# Patient Record
Sex: Female | Born: 1990 | Race: Black or African American | Hispanic: No | Marital: Single | Smoking: Current every day smoker
Health system: Southern US, Community
[De-identification: ages and names within clinical notes are randomized; demographics above are authoritative.]

---

## 2018-10-04 ENCOUNTER — Other Ambulatory Visit: Payer: Self-pay

## 2018-10-04 ENCOUNTER — Emergency Department
Admission: EM | Admit: 2018-10-04 | Discharge: 2018-10-04 | Disposition: A | Discharge status: Home / Self Care | Payer: Self-pay | Attending: Emergency Medicine | Admitting: Emergency Medicine

## 2018-10-04 DIAGNOSIS — N739 Female pelvic inflammatory disease, unspecified: Secondary | ICD-10-CM

## 2018-10-04 LAB — COMPREHENSIVE METABOLIC PANEL
ALT: 16 U/L (ref 0–44)
ANION GAP: 6 (ref 5–15)
AST: 22 U/L (ref 15–41)
Albumin: 3.8 g/dL (ref 3.5–5.0)
Alkaline Phosphatase: 57 U/L (ref 38–126)
BUN: 10 mg/dL (ref 6–20)
CHLORIDE: 108 mmol/L (ref 98–111)
CO2: 22 mmol/L (ref 22–32)
Calcium: 8.2 mg/dL — ABNORMAL LOW (ref 8.9–10.3)
Creatinine, Ser: 0.75 mg/dL (ref 0.44–1.00)
GFR calc Af Amer: >60 mL/min (ref >60)
GFR calc non Af Amer: >60 mL/min (ref >60)
Glucose, Bld: 104 mg/dL — ABNORMAL HIGH (ref 70–99)
Potassium: 3.3 mmol/L — ABNORMAL LOW (ref 3.5–5.1)
Sodium: 136 mmol/L (ref 135–145)
Total Bilirubin: 0.3 mg/dL (ref 0.3–1.2)
Total Protein: 7.2 g/dL (ref 6.5–8.1)

## 2018-10-04 LAB — CBC
HEMATOCRIT: 34.7 % — AB (ref 36.0–46.0)
Hemoglobin: 11.2 g/dL — ABNORMAL LOW (ref 12.0–15.0)
MCH: 26.4 pg (ref 26.0–34.0)
MCHC: 32.3 g/dL (ref 30.0–36.0)
MCV: 81.6 fL (ref 80.0–100.0)
Platelets: 170 10*3/uL (ref 150–400)
RBC: 4.25 MIL/uL (ref 3.87–5.11)
RDW: 16.5 % — ABNORMAL HIGH (ref 11.5–15.5)
WBC: 3.2 10*3/uL — ABNORMAL LOW (ref 4.0–10.5)
nRBC: 0 % (ref 0.0–0.2)

## 2018-10-04 LAB — POCT PREGNANCY, URINE: PREG TEST UR: NEGATIVE

## 2018-10-04 LAB — CHLAMYDIA/NGC RT PCR (ARMC ONLY)
Chlamydia Tr: NOT DETECTED
N gonorrhoeae: NOT DETECTED

## 2018-10-04 LAB — URINALYSIS, COMPLETE (UACMP) WITH MICROSCOPIC
BACTERIA UA: NONE SEEN
Bilirubin Urine: NEGATIVE
Glucose, UA: NEGATIVE mg/dL
Hgb urine dipstick: NEGATIVE
Ketones, ur: NEGATIVE mg/dL
Leukocytes, UA: NEGATIVE
Nitrite: NEGATIVE
Protein, ur: 30 mg/dL — AB
Specific Gravity, Urine: 1.031 — ABNORMAL HIGH (ref 1.005–1.030)
pH: 5 (ref 5.0–8.0)

## 2018-10-04 LAB — WET PREP, GENITAL
Clue Cells Wet Prep HPF POC: NONE SEEN
Sperm: NONE SEEN
Trich, Wet Prep: NONE SEEN
Yeast Wet Prep HPF POC: NONE SEEN

## 2018-10-04 LAB — LIPASE, BLOOD: Lipase: 30 U/L (ref 11–51)

## 2018-10-04 MED ORDER — HYDROCODONE-ACETAMINOPHEN 5-325 MG PO TABS: 1.0000 | ORAL_TABLET | Freq: Four times a day (QID) | ORAL | 0 refills | Status: AC | PRN

## 2018-10-04 MED ORDER — CEFTRIAXONE SODIUM 250 MG IJ SOLR
250.0000 mg | Freq: Once | INTRAMUSCULAR | Status: AC
  Administered 2018-10-04: 250 mg via INTRAMUSCULAR
  Filled 2018-10-04: qty 250

## 2018-10-04 MED ORDER — DOXYCYCLINE HYCLATE 100 MG PO TABS: 100.0000 mg | ORAL_TABLET | Freq: Two times a day (BID) | ORAL | 0 refills | Status: AC

## 2018-10-04 MED ORDER — IBUPROFEN 600 MG PO TABS
600.0000 mg | ORAL_TABLET | Freq: Once | ORAL | Status: AC
  Administered 2018-10-04: 600 mg via ORAL
  Filled 2018-10-04: qty 1

## 2018-10-04 MED ORDER — AZITHROMYCIN 500 MG PO TABS
1000.0000 mg | ORAL_TABLET | Freq: Once | ORAL | Status: AC
  Administered 2018-10-04: 1000 mg via ORAL
  Filled 2018-10-04: qty 2

## 2018-10-04 NOTE — ED Provider Notes (Signed)
[facility] Emergency Department Provider Note  ____________________________________________   First MD Initiated Contact with Patient 10/04/18 [phone]     (approximate)  I have reviewed the triage vital signs and the nursing notes.   HISTORY  Chief Complaint Abdominal Pain   HPI Kim Mendez is a 27 y.o. female who presents to the emergency department with  right greater than left lower abdominal/pelvic pain for the past several days.  She is concerned because her boyfriend's ex-girlfriend notified him yesterday that she had contracted a sexually transmitted infection and the patient herself is worried that she may have contracted it as well.  She gets routine HIV testing and had her last negative test in November 2019.  The patient's last sexual intercourse was a week ago.  She denies increased vaginal discharge.  No dysuria frequency or hesitancy.  Her abdominal pain is mild to moderate severity cramping and aching and mostly constant.  Is nonradiating.  Nothing seems to make it better or worse.   No past medical history on file.  There are no active problems to display for this patient.     Prior to Admission medications   Medication Sig Start Date End Date Taking? Authorizing Provider  doxycycline (VIBRA-TABS) 100 MG tablet Take 1 tablet (100 mg total) by mouth 2 (two) times daily. 10/04/18   Merrily Brittleifenbark, Larsen Dungan, MD  HYDROcodone-acetaminophen (NORCO) 5-325 MG tablet Take 1 tablet by mouth every 6 (six) hours as needed for up to 7 doses for severe pain. 10/04/18   Merrily Brittleifenbark, Landry Lookingbill, MD    Allergies Patient has no known allergies.  No family history on file.  Social History Social History   Tobacco Use  . Smoking status: Not on file  Substance Use Topics  . Alcohol use: Not on file  . Drug use: Not on file    Review of Systems Constitutional: No fever/chills Eyes: No visual changes. ENT: No sore throat. Cardiovascular: Denies chest  pain. Respiratory: Denies shortness of breath. Gastrointestinal: Positive for abdominal pain.  No nausea, no vomiting.  No diarrhea.  No constipation. Genitourinary: Positive for pelvic pain Musculoskeletal: Negative for back pain. Skin: Negative for rash. Neurological: Negative for headaches, focal weakness or numbness.   ____________________________________________   PHYSICAL EXAM:  VITAL SIGNS: ED Triage Vitals  Enc Vitals Group     BP 10/04/18 0134 (!) 112/58     Pulse Rate 10/04/18 0134 78     Resp 10/04/18 0134 18     Temp 10/04/18 0134 99.5 F (37.5 C)     Temp Source 10/04/18 0134 Oral     SpO2 10/04/18 0134 99 %     Weight 10/04/18 0132 207 lb (93.9 kg)     Height 10/04/18 0132 5\' 2"  (1.575 m)     Head Circumference --      Peak Flow --      Pain Score 10/04/18 0132 0     Pain Loc --      Pain Edu? --      Excl. in GC? --     Constitutional: Alert and oriented x4 appears somewhat uncomfortable though nontoxic no diaphoresis speaks in full clear sentences Eyes: PERRL EOMI. Head: Atraumatic. Nose: No congestion/rhinnorhea. Mouth/Throat: No trismus Neck: No stridor.   Cardiovascular: Normal rate, regular rhythm. Grossly normal heart sounds.  Good peripheral circulation. Respiratory: Normal respiratory effort.  No retractions. Lungs CTAB and moving good air Gastrointestinal: Soft nondistended mild right greater than left suprapubic tenderness.  No McBurney's tenderness.  Negative Rovsing's. Pelvic exam chaperoned by female nurse: Normal external exam.  Office is closed.  Moderate amount of thick discharge in the vault although nonpurulent.  No cervical motion tenderness.  Positive right adnexal tenderness Musculoskeletal: No lower extremity edema   Neurologic:  Normal speech and language. No gross focal neurologic deficits are appreciated. Skin:  Skin is warm, dry and intact. No rash noted. Psychiatric: Mood and affect are normal. Speech and behavior are  normal.    ____________________________________________   DIFFERENTIAL includes but not limited to  Pelvic inflammatory disease, cervicitis, appendicitis, urinary tract infection ____________________________________________   LABS (all labs ordered are listed, but only abnormal results are displayed)  Labs Reviewed  WET PREP, GENITAL - Abnormal; Notable for the following components:      Result Value   WBC, Wet Prep HPF POC RARE (*)    All other components within normal limits  COMPREHENSIVE METABOLIC PANEL - Abnormal; Notable for the following components:   Potassium 3.3 (*)    Glucose, Bld 104 (*)    Calcium 8.2 (*)    All other components within normal limits  CBC - Abnormal; Notable for the following components:   WBC 3.2 (*)    Hemoglobin 11.2 (*)    HCT 34.7 (*)    RDW 16.5 (*)    All other components within normal limits  URINALYSIS, COMPLETE (UACMP) WITH MICROSCOPIC - Abnormal; Notable for the following components:   Color, Urine AMBER (*)    APPearance CLOUDY (*)    Specific Gravity, Urine 1.031 (*)    Protein, ur 30 (*)    All other components within normal limits  CHLAMYDIA/NGC RT PCR ([facility] ONLY)  LIPASE, BLOOD  POCT PREGNANCY, URINE    Lab work reviewed by me is negative for gonorrhea and chlamydia.  Slightly low white count is nonspecific __________________________________________  EKG   ____________________________________________  RADIOLOGY   ____________________________________________   PROCEDURES  Procedure(s) performed: no  Procedures  Critical Care performed: no  ____________________________________________   INITIAL IMPRESSION / ASSESSMENT AND PLAN / ED COURSE  Pertinent labs & imaging results that were available during my care of the patient were reviewed by me and considered in my medical decision making (see chart for details).   As part of my medical decision making, I reviewed the following data within the  electronic MEDICAL RECORD NUMBER History obtained from family if available, nursing notes, old chart and ekg, as well as notes from prior ED visits.  Patient comes to the emergency department with several days of pelvic pain.  She is quite concerned understandably so about sexually transmitted infection.  Her abdominal pain is on the right greater than left although it is well below McBurney's point and she has no rebound or guarding.  Pelvic exam performed showing mostly physiologic discharge although she does have right adnexal tenderness.  We discussed calling back with results versus presumptive treatment now and she is opted for presumptive treatment which I think is most reasonable.  Given ceftriaxone as well as azithromycin and I will discharge her home with doxycycline and hydrocodone for the pain.  Strict return precautions have been given and we will call her back with the results.      ____________________________________________   FINAL CLINICAL IMPRESSION(S) / ED DIAGNOSES  Final diagnoses:  Pelvic inflammatory disease  Cluster B personality disorder (HCC)      NEW MEDICATIONS STARTED DURING THIS VISIT:  Discharge Medication List as of 10/04/2018  6:30 AM  START taking these medications   Details  doxycycline (VIBRA-TABS) 100 MG tablet Take 1 tablet (100 mg total) by mouth 2 (two) times daily., Starting Mon 10/04/2018, Print    HYDROcodone-acetaminophen (NORCO) 5-325 MG tablet Take 1 tablet by mouth every 6 (six) hours as needed for up to 7 doses for severe pain., Starting Mon 10/04/2018, Print         Note:  This document was prepared using Dragon voice recognition software and may include unintentional dictation errors.    Merrily Brittleifenbark, Makayle Krahn, MD 10/06/18 [phone]

## 2018-10-04 NOTE — ED Triage Notes (Signed)
Patient states off/on abdominal pain.  States found out today that her boyfriend was told he had been exposed to an STD.

## 2018-10-04 NOTE — Discharge Instructions (Signed)
It was a pleasure to take care of you today, and thank you for coming to our emergency department.  If you have any questions or concerns before leaving please ask the nurse to grab me and I'm more than happy to go through your aftercare instructions again.  If you were prescribed any opioid pain medication today such as Norco, Vicodin, Percocet, morphine, hydrocodone, or oxycodone please make sure you do not drive when you are taking this medication as it can alter your ability to drive safely.  If you have any concerns once you are home that you are not improving or are in fact getting worse before you can make it to your follow-up appointment, please do not hesitate to call 911 and come back for further evaluation.  Merrily BrittleNeil Odis Wickey, MD  Results for orders placed or performed during the hospital encounter of 10/04/18  Lipase, blood  Result Value Ref Range   Lipase 30 11 - 51 U/L  Comprehensive metabolic panel  Result Value Ref Range   Sodium 136 135 - 145 mmol/L   Potassium 3.3 (L) 3.5 - 5.1 mmol/L   Chloride 108 98 - 111 mmol/L   CO2 22 22 - 32 mmol/L   Glucose, Bld 104 (H) 70 - 99 mg/dL   BUN 10 6 - 20 mg/dL   Creatinine, Ser 1.610.75 0.44 - 1.00 mg/dL   Calcium 8.2 (L) 8.9 - 10.3 mg/dL   Total Protein 7.2 6.5 - 8.1 g/dL   Albumin 3.8 3.5 - 5.0 g/dL   AST 22 15 - 41 U/L   ALT 16 0 - 44 U/L   Alkaline Phosphatase 57 38 - 126 U/L   Total Bilirubin 0.3 0.3 - 1.2 mg/dL   GFR calc non Af Amer >60 >60 mL/min   GFR calc Af Amer >60 >60 mL/min   Anion gap 6 5 - 15  CBC  Result Value Ref Range   WBC 3.2 (L) 4.0 - 10.5 K/uL   RBC 4.25 3.87 - 5.11 MIL/uL   Hemoglobin 11.2 (L) 12.0 - 15.0 g/dL   HCT 09.634.7 (L) 04.536.0 - 40.946.0 %   MCV 81.6 80.0 - 100.0 fL   MCH 26.4 26.0 - 34.0 pg   MCHC 32.3 30.0 - 36.0 g/dL   RDW 81.116.5 (H) 91.411.5 - 78.215.5 %   Platelets 170 150 - 400 K/uL   nRBC 0.0 0.0 - 0.2 %  Urinalysis, Complete w Microscopic  Result Value Ref Range   Color, Urine AMBER (A) YELLOW   APPearance CLOUDY (A) CLEAR   Specific Gravity, Urine 1.031 (H) 1.005 - 1.030   pH 5.0 5.0 - 8.0   Glucose, UA NEGATIVE NEGATIVE mg/dL   Hgb urine dipstick NEGATIVE NEGATIVE   Bilirubin Urine NEGATIVE NEGATIVE   Ketones, ur NEGATIVE NEGATIVE mg/dL   Protein, ur 30 (A) NEGATIVE mg/dL   Nitrite NEGATIVE NEGATIVE   Leukocytes, UA NEGATIVE NEGATIVE   RBC / HPF 6-10 0 - 5 RBC/hpf   WBC, UA 0-5 0 - 5 WBC/hpf   Bacteria, UA NONE SEEN NONE SEEN   Squamous Epithelial / LPF 21-50 0 - 5   Mucus PRESENT   Pregnancy, urine POC  Result Value Ref Range   Preg Test, Ur NEGATIVE NEGATIVE

## 2018-10-05 ENCOUNTER — Telehealth: Payer: Self-pay | Admitting: Emergency Medicine

## 2018-10-05 NOTE — Telephone Encounter (Signed)
Patient called and asked for results for std.  Gave her results, but instructed to finish all antibiotics because of dx pid.  She agrees.

## 2019-03-23 ENCOUNTER — Other Ambulatory Visit: Payer: Self-pay

## 2019-03-23 ENCOUNTER — Encounter: Payer: Self-pay | Admitting: *Deleted

## 2019-03-23 DIAGNOSIS — X500XXA Overexertion from strenuous movement or load, initial encounter: Secondary | ICD-10-CM | POA: Insufficient documentation

## 2019-03-23 DIAGNOSIS — Z79899 Other long term (current) drug therapy: Secondary | ICD-10-CM | POA: Insufficient documentation

## 2019-03-23 DIAGNOSIS — Y929 Unspecified place or not applicable: Secondary | ICD-10-CM | POA: Insufficient documentation

## 2019-03-23 DIAGNOSIS — S29012A Strain of muscle and tendon of back wall of thorax, initial encounter: Secondary | ICD-10-CM | POA: Insufficient documentation

## 2019-03-23 DIAGNOSIS — Y9389 Activity, other specified: Secondary | ICD-10-CM | POA: Insufficient documentation

## 2019-03-23 DIAGNOSIS — F17200 Nicotine dependence, unspecified, uncomplicated: Secondary | ICD-10-CM | POA: Insufficient documentation

## 2019-03-23 DIAGNOSIS — Y990 Civilian activity done for income or pay: Secondary | ICD-10-CM | POA: Insufficient documentation

## 2019-03-23 NOTE — ED Triage Notes (Addendum)
Pt picked up a heavy object yesterday and now has upper back pain.  No otc meds .  Pt alert.

## 2019-03-24 ENCOUNTER — Encounter: Payer: Self-pay | Admitting: Emergency Medicine

## 2019-03-24 ENCOUNTER — Emergency Department
Admission: EM | Admit: 2019-03-24 | Discharge: 2019-03-24 | Disposition: A | Discharge status: Home / Self Care | Payer: Self-pay | Attending: Emergency Medicine | Admitting: Emergency Medicine

## 2019-03-24 DIAGNOSIS — S29012A Strain of muscle and tendon of back wall of thorax, initial encounter: Secondary | ICD-10-CM

## 2019-03-24 MED ORDER — CYCLOBENZAPRINE HCL 10 MG PO TABS
5.0000 mg | ORAL_TABLET | Freq: Once | ORAL | Status: AC
  Administered 2019-03-24: 5 mg via ORAL
  Filled 2019-03-24: qty 1

## 2019-03-24 MED ORDER — IBUPROFEN 600 MG PO TABS
600.0000 mg | ORAL_TABLET | Freq: Once | ORAL | Status: AC
  Administered 2019-03-24: 600 mg via ORAL
  Filled 2019-03-24: qty 1

## 2019-03-24 MED ORDER — ACETAMINOPHEN 500 MG PO TABS
1000.0000 mg | ORAL_TABLET | Freq: Once | ORAL | Status: AC
  Administered 2019-03-24: 1000 mg via ORAL
  Filled 2019-03-24: qty 2

## 2019-03-24 MED ORDER — LIDOCAINE 5 % EX PTCH: 1.0000 | MEDICATED_PATCH | Freq: Two times a day (BID) | CUTANEOUS | 0 refills | Status: AC

## 2019-03-24 MED ORDER — LIDOCAINE 5 % EX PTCH
1.0000 | MEDICATED_PATCH | CUTANEOUS | Status: DC
  Filled 2019-03-24: qty 1

## 2019-03-24 MED ORDER — CYCLOBENZAPRINE HCL 5 MG PO TABS: 5.0000 mg | ORAL_TABLET | Freq: Three times a day (TID) | ORAL | 0 refills | Status: DC | PRN

## 2019-03-24 NOTE — ED Provider Notes (Signed)
[facility] Emergency Department Provider Note  ____________________________________________   First MD Initiated Contact with Patient 03/24/19 [phone]     (approximate)  I have reviewed the triage vital signs and the nursing notes.   HISTORY  Chief Complaint Back Pain    HPI Kim Mendez is a 28 y.o. female reports no chronic medical issues and presents for evaluation of acute onset pain in the middle of her back on the right side.  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  At the time it did not bother her too much but by today it was quite painful and she felt like it was limiting her ability to raise both of her arms.  It feels tight and feels like it is spasming or cramping.  Nothing particular makes it better and moving around makes it worse.  She feels a little bit better when she sits up as opposed to lies down.  She has not had back problems in the past.  No numbness nor tingling, no weakness in either extremity, no difficulty with ambulation.  No recent contact with COVID-19 patients.  No fever, chills, cough, sore throat, chest pain, shortness of breath, nausea, vomiting, nor abdominal pain.         History reviewed. No pertinent past medical history.  There are no active problems to display for this patient.   History reviewed. No pertinent surgical history.  Prior to Admission medications   Medication Sig Start Date End Date Taking? Authorizing Provider  cyclobenzaprine (FLEXERIL) 5 MG tablet Take 1 tablet (5 mg total) by mouth 3 (three) times daily as needed for muscle spasms. 03/24/19   Loleta RoseForbach, Sircharles Holzheimer, MD  doxycycline (VIBRA-TABS) 100 MG tablet Take 1 tablet (100 mg total) by mouth 2 (two) times daily. 10/04/18   Merrily Brittleifenbark, Neil, MD  HYDROcodone-acetaminophen (NORCO) 5-325 MG tablet Take 1 tablet by mouth every 6 (six) hours as needed for  up to 7 doses for severe pain. 10/04/18   Merrily Brittleifenbark, Neil, MD  lidocaine (LIDODERM) 5 % Place 1 patch onto the skin every 12 (twelve) hours. Remove & Discard patch within 12 hours or as directed by MD.  Wynelle FannyLeave the patch off for 12 hours before applying a new one. 03/24/19 03/23/20  Loleta RoseForbach, Luciano Cinquemani, MD    Allergies Patient has no known allergies.  History reviewed. No pertinent family history.  Social History Social History   Tobacco Use   Smoking status: Current Every Day Smoker   Smokeless tobacco: Never Used  Substance Use Topics   Alcohol use: Not Currently   Drug use: Not Currently    Review of Systems Constitutional: No fever/chills Eyes: No visual changes. ENT: No sore throat. Cardiovascular: Denies chest pain. Respiratory: Denies shortness of breath. Gastrointestinal: No abdominal pain.  No nausea, no vomiting.  No diarrhea.  No constipation. Musculoskeletal: Pain in the right side of the middle of her back as described above. Neurological: Negative for headaches, focal weakness or numbness.   ____________________________________________   PHYSICAL EXAM:  VITAL SIGNS: ED Triage Vitals  Enc Vitals Group     BP 03/23/19 2055 100/61     Pulse Rate 03/23/19 2055 70     Resp 03/23/19 2055 18     Temp 03/23/19 2055 99.2 F (37.3 C)     Temp Source 03/23/19 2055 Oral     SpO2 03/23/19  2055 99 %     Weight 03/23/19 2059 89.8 kg (198 lb)     Height 03/23/19 2059 1.6 m (5\' 3" )     Head Circumference --      Peak Flow --      Pain Score 03/23/19 2057 8     Pain Loc --      Pain Edu? --      Excl. in GC? --     Constitutional: Alert and oriented. Well appearing and in no acute distress. Head: Atraumatic. Cardiovascular: Normal rate, regular rhythm. Good peripheral circulation. Grossly normal heart sounds. Respiratory: Normal respiratory effort.   Gastrointestinal: Soft and nontender. No distention.  Neurologic:  Normal speech and language. No gross focal  neurologic deficits are appreciated.  MSK: The patient has no tenderness to palpation of the spine but has some right-sided paraspinal muscle tenderness to palpation in the midthoracic region.  There is no erythema, no fluctuance, no induration, no evidence of acute infection. Skin:  Skin is warm, dry and intact. No rash noted. Psychiatric: Mood and affect are normal. Speech and behavior are normal.  ____________________________________________   LABS (all labs ordered are listed, but only abnormal results are displayed)  Labs Reviewed - No data to display ____________________________________________  EKG  None - EKG not ordered by ED physician ____________________________________________  RADIOLOGY   ED MD interpretation: No indication for imaging  Official radiology report(s): No results found.  ____________________________________________   PROCEDURES   Procedure(s) performed (including Critical Care):  Procedures   ____________________________________________   INITIAL IMPRESSION / MDM / ASSESSMENT AND PLAN / ED COURSE  As part of my medical decision making, I reviewed the following data within the electronic MEDICAL RECORD NUMBER Nursing notes reviewed and incorporated, Notes from prior ED visits and [place] Controlled Substance Database      *Kim Mendez was evaluated in Emergency Department on 03/24/2019 for the symptoms described in the history of present illness. She was evaluated in the context of the global COVID-19 pandemic, which necessitated consideration that the patient might be at risk for infection with the SARS-CoV-2 virus that causes COVID-19. Institutional protocols and algorithms that pertain to the evaluation of patients at risk for COVID-19 are in a state of rapid change based on information released by regulatory bodies including the CDC and federal and state organizations. These policies and algorithms were followed during the patient's care in the ED.   Some ED evaluations and interventions may be delayed as a result of limited staffing during the pandemic.*  Signs and symptoms are consistent with mild muscle strain.  No indication for imaging given a minimal mechanism, the patient's otherwise young and healthy status, and no tenderness to palpation of the spine itself.  Lidoderm, Flexeril, NSAIDs, Tylenol, heating pad, usual and customary return precautions.  She understands and agrees with the plan.      ____________________________________________  FINAL CLINICAL IMPRESSION(S) / ED DIAGNOSES  Final diagnoses:  Strain of thoracic back region     MEDICATIONS GIVEN DURING THIS VISIT:  Medications  cyclobenzaprine (FLEXERIL) tablet 5 mg (has no administration in time range)  lidocaine (LIDODERM) 5 % 1 patch (has no administration in time range)  ibuprofen (ADVIL) tablet 600 mg (has no administration in time range)  acetaminophen (TYLENOL) tablet 1,000 mg (has no administration in time range)     ED Discharge Orders         Ordered    lidocaine (LIDODERM) 5 %  Every 12 hours  03/24/19 0105    cyclobenzaprine (FLEXERIL) 5 MG tablet  3 times daily PRN     03/24/19 0105           Note:  This document was prepared using Dragon voice recognition software and may include unintentional dictation errors.   Hinda Kehr, MD 03/24/19 [phone]

## 2019-03-24 NOTE — ED Notes (Signed)
ED Provider at bedside. 

## 2020-09-20 ENCOUNTER — Emergency Department
Admission: EM | Admit: 2020-09-20 | Discharge: 2020-09-20 | Disposition: A | Discharge status: Home / Self Care | Payer: Self-pay | Attending: Emergency Medicine | Admitting: Emergency Medicine

## 2020-09-20 ENCOUNTER — Encounter: Payer: Self-pay | Admitting: *Deleted

## 2020-09-20 ENCOUNTER — Other Ambulatory Visit: Payer: Self-pay

## 2020-09-20 DIAGNOSIS — R030 Elevated blood-pressure reading, without diagnosis of hypertension: Secondary | ICD-10-CM | POA: Insufficient documentation

## 2020-09-20 DIAGNOSIS — M5431 Sciatica, right side: Secondary | ICD-10-CM | POA: Insufficient documentation

## 2020-09-20 DIAGNOSIS — F17200 Nicotine dependence, unspecified, uncomplicated: Secondary | ICD-10-CM | POA: Insufficient documentation

## 2020-09-20 MED ORDER — IBUPROFEN 600 MG PO TABS
600.0000 mg | ORAL_TABLET | Freq: Once | ORAL | Status: AC
  Administered 2020-09-20: 600 mg via ORAL
  Filled 2020-09-20: qty 1

## 2020-09-20 MED ORDER — PREDNISONE 20 MG PO TABS
60.0000 mg | ORAL_TABLET | Freq: Once | ORAL | Status: AC
  Administered 2020-09-20: 60 mg via ORAL
  Filled 2020-09-20: qty 3

## 2020-09-20 MED ORDER — IBUPROFEN 600 MG PO TABS: 600.0000 mg | ORAL_TABLET | Freq: Four times a day (QID) | ORAL | 0 refills | Status: AC | PRN

## 2020-09-20 MED ORDER — PREDNISONE 20 MG PO TABS: 60.0000 mg | ORAL_TABLET | Freq: Every day | ORAL | 0 refills | Status: AC

## 2020-09-20 MED ORDER — CYCLOBENZAPRINE HCL 10 MG PO TABS: 10.0000 mg | ORAL_TABLET | Freq: Three times a day (TID) | ORAL | 0 refills | Status: AC | PRN

## 2020-09-20 NOTE — Discharge Instructions (Signed)
Take the prednisone as prescribed, 60 mg daily for the next 5 days.  You should use the ibuprofen every 6 hours with meals.  You may take the Flexeril (cyclobenzaprine) up to 3 times daily if you have muscle spasm or tightness.  Return to the ER immediately for new, worsening, or persistent severe weakness, numbness, inability to move the leg, difficulty walking, or any other new or worsening symptoms that concern you.

## 2020-09-20 NOTE — ED Notes (Signed)
Arrived to pt's bedside to administer ordered medication; pt is not there nor is her visitor.

## 2020-09-20 NOTE — ED Triage Notes (Signed)
Pt is here for sharp pain radiating down right leg. Pain is worst when she moves from sitting to standing position and extra painful at night.  No recent trauma, pt has had same pain previously but never this long or this severe.

## 2020-09-20 NOTE — ED Notes (Signed)
Pt reports pain that encompasses the top of her right thigh and down to her knee and then intermittent right calf pain. Pt reports pain in her right calf with plantar flexion against resistance; pain to right knee with dorsiflexion against resistance

## 2020-09-20 NOTE — ED Provider Notes (Signed)
[facility] Emergency Department Provider Note ____________________________________________   Event Date/Time   First MD Initiated Contact with Patient 09/20/20 1704     (approximate)  I have reviewed the triage vital signs and the nursing notes.   HISTORY  Chief Complaint Leg Pain    HPI Kim Mendez is a 29 y.o. female with no active medical problems who presents with right leg pain over the last several days, initially presenting with numbness and tingling going down the leg, and now more painful.  It radiates from her buttocks down the leg.  She denies any weakness and is able to ambulate.  She has had no trauma or injury.  She denies any pain to the back.  History reviewed. No pertinent past medical history.  There are no problems to display for this patient.   History reviewed. No pertinent surgical history.  Prior to Admission medications   Medication Sig Start Date End Date Taking? Authorizing Provider  cyclobenzaprine (FLEXERIL) 10 MG tablet Take 1 tablet (10 mg total) by mouth 3 (three) times daily as needed for up to 5 days for muscle spasms. 09/20/20 09/25/20  Dionne Bucy, MD  doxycycline (VIBRA-TABS) 100 MG tablet Take 1 tablet (100 mg total) by mouth 2 (two) times daily. 10/04/18   Merrily Brittle, MD  HYDROcodone-acetaminophen (NORCO) 5-325 MG tablet Take 1 tablet by mouth every 6 (six) hours as needed for up to 7 doses for severe pain. 10/04/18   Merrily Brittle, MD  ibuprofen (ADVIL) 600 MG tablet Take 1 tablet (600 mg total) by mouth every 6 (six) hours as needed. 09/20/20   Dionne Bucy, MD  predniSONE (DELTASONE) 20 MG tablet Take 3 tablets (60 mg total) by mouth daily with breakfast for 5 days. 09/21/20 09/26/20  Dionne Bucy, MD    Allergies Patient has no known allergies.  No family history on file.  Social History Social History   Tobacco Use  . Smoking status: Current Every Day Smoker  . Smokeless  tobacco: Never Used  Substance Use Topics  . Alcohol use: Not Currently  . Drug use: Not Currently    Review of Systems  Constitutional: No fever/chills Eyes: No visual changes. ENT: No sore throat. Cardiovascular: Denies chest pain. Respiratory: Denies shortness of breath. Gastrointestinal: No abdominal pain. Genitourinary: Negative for dysuria, retention, or incontinence.  Musculoskeletal: Negative for back pain.  Positive for right leg pain. Skin: Negative for rash. Neurological: Negative for focal weakness.   ____________________________________________   PHYSICAL EXAM:  VITAL SIGNS: ED Triage Vitals [09/20/20 1424]  Enc Vitals Group     BP (!) 140/107     Pulse Rate 74     Resp 16     Temp 99 F (37.2 C)     Temp Source Oral     SpO2 99 %     Weight      Height      Head Circumference      Peak Flow      Pain Score 10     Pain Loc      Pain Edu?      Excl. in GC?     Constitutional: Alert and oriented. Well appearing and in no acute distress. Eyes: Conjunctivae are normal.  Head: Atraumatic. Nose: No congestion/rhinnorhea. Mouth/Throat: Mucous membranes are moist.   Neck: Normal range of motion.  Cardiovascular: Normal rate, regular rhythm.  Good peripheral circulation. Respiratory: Normal respiratory effort.  No retractions.  Gastrointestinal: No distention.  Musculoskeletal: No lower  extremity edema.  Extremities warm and well perfused.  Negative straight leg raise on the right.  Pain in the thigh and buttock when flexing the right hip and leg.  Good distal pulse and cap refill. Neurologic:  Normal speech and language.  Minimal weakness on plantar flexion on the right, likely due to pain/effort.  Otherwise 5/5 motor strength and intact sensation to the right lower extremity. Skin:  Skin is warm and dry. No rash noted. Psychiatric: Mood and affect are normal. Speech and behavior are normal.  ____________________________________________   LABS (all  labs ordered are listed, but only abnormal results are displayed)  Labs Reviewed - No data to display ____________________________________________  EKG  ____________________________________________  RADIOLOGY    ____________________________________________   PROCEDURES  Procedure(s) performed: No  Procedures  Critical Care performed: No ____________________________________________   INITIAL IMPRESSION / ASSESSMENT AND PLAN / ED COURSE  Pertinent labs & imaging results that were available during my care of the patient were reviewed by me and considered in my medical decision making (see chart for details).  29 year old female with no active medical problems presents with right leg pain over the last week with some numbness and paresthesias.  She is able to ambulate.  She denies any urinary incontinence or other associated symptoms.  On exam she is well-appearing.  Vital signs are normal except for mild hypertension.  She has some pain in the buttock and thigh on flexion of the hip and leg but straight leg raise test is negative and there is no significant motor deficit.  Overall presentation is consistent with sciatica versus muscle strain/spasm.  I offered an MRI given the questionable weakness (which appeared more consistent with pain and resulting poor effort during exam) but the patient declined.  She understands that without doing imaging I cannot fully rule out a bulging disc or other impingement on her spine or proximal nerves.  She wishes to proceed with empiric symptomatic treatment.  I will prescribe prednisone, ibuprofen, and Flexeril.    Return precautions given, and the patient expresses understanding.  ____________________________________________   FINAL CLINICAL IMPRESSION(S) / ED DIAGNOSES  Final diagnoses:  Sciatica of right side      NEW MEDICATIONS STARTED DURING THIS VISIT:  New Prescriptions   CYCLOBENZAPRINE (FLEXERIL) 10 MG TABLET    Take 1  tablet (10 mg total) by mouth 3 (three) times daily as needed for up to 5 days for muscle spasms.   IBUPROFEN (ADVIL) 600 MG TABLET    Take 1 tablet (600 mg total) by mouth every 6 (six) hours as needed.   PREDNISONE (DELTASONE) 20 MG TABLET    Take 3 tablets (60 mg total) by mouth daily with breakfast for 5 days.     Note:  This document was prepared using Dragon voice recognition software and may include unintentional dictation errors.    Dionne Bucy, MD 09/20/20 1736

## 2020-11-09 DIAGNOSIS — Z20822 Contact with and (suspected) exposure to covid-19: Secondary | ICD-10-CM | POA: Diagnosis not present

## 2021-01-26 ENCOUNTER — Emergency Department
Admission: EM | Admit: 2021-01-26 | Discharge: 2021-01-26 | Disposition: A | Discharge status: Home / Self Care | Payer: Medicaid Other | Attending: Physician Assistant | Admitting: Physician Assistant

## 2021-01-26 ENCOUNTER — Emergency Department: Payer: Medicaid Other

## 2021-01-26 ENCOUNTER — Other Ambulatory Visit: Payer: Self-pay

## 2021-01-26 DIAGNOSIS — J4541 Moderate persistent asthma with (acute) exacerbation: Secondary | ICD-10-CM | POA: Insufficient documentation

## 2021-01-26 DIAGNOSIS — F17200 Nicotine dependence, unspecified, uncomplicated: Secondary | ICD-10-CM | POA: Insufficient documentation

## 2021-01-26 DIAGNOSIS — R000 Tachycardia, unspecified: Secondary | ICD-10-CM | POA: Insufficient documentation

## 2021-01-26 MED ORDER — IPRATROPIUM-ALBUTEROL 0.5-2.5 (3) MG/3ML IN SOLN
3.0000 mL | Freq: Once | RESPIRATORY_TRACT | Status: AC
  Administered 2021-01-26: 3 mL via RESPIRATORY_TRACT
  Filled 2021-01-26: qty 3

## 2021-01-26 MED ORDER — ALBUTEROL SULFATE HFA 108 (90 BASE) MCG/ACT IN AERS: 2.0000 | INHALATION_SPRAY | Freq: Four times a day (QID) | RESPIRATORY_TRACT | 2 refills | Status: AC | PRN

## 2021-01-26 MED ORDER — METHYLPREDNISOLONE SODIUM SUCC 125 MG IJ SOLR
80.0000 mg | Freq: Once | INTRAMUSCULAR | Status: AC
  Administered 2021-01-26: 80 mg via INTRAMUSCULAR
  Filled 2021-01-26: qty 2

## 2021-01-26 MED ORDER — BENZONATATE 100 MG PO CAPS: 200.0000 mg | ORAL_CAPSULE | Freq: Three times a day (TID) | ORAL | 0 refills | Status: AC | PRN

## 2021-01-26 MED ORDER — METHYLPREDNISOLONE 4 MG PO TBPK: ORAL_TABLET | ORAL | 0 refills | Status: AC

## 2021-01-26 NOTE — ED Triage Notes (Signed)
Pt states she has had difficulty breathing since last night- pt having difficulty speaking in complete sentences, states it feels tight in her chest like she cannot get enough air in

## 2021-01-26 NOTE — ED Provider Notes (Signed)
[facility] Emergency Department Provider Note   ____________________________________________   Event Date/Time   First MD Initiated Contact with Patient 01/26/21 1243     (approximate)  I have reviewed the triage vital signs and the nursing notes.   HISTORY  Chief Complaint Asthma    HPI Bellami Robinson is a 30 y.o. female patient presents difficulty breathing.  Last night.  Patient speaks in short sentences secondary to wheezing.  Patient states her chest feels "tight" and if she cannot get enough air in.  Patient has a history of asthma.  States no relief with inhaler last night.  No palliative measures this morning.  Rates her pain discomfort a 7/10.      History reviewed. No pertinent past medical history.  There are no problems to display for this patient.   History reviewed. No pertinent surgical history.  Prior to Admission medications   Medication Sig Start Date End Date Taking? Authorizing Provider  albuterol (VENTOLIN HFA) 108 (90 Base) MCG/ACT inhaler Inhale 2 puffs into the lungs every 6 (six) hours as needed for wheezing or shortness of breath. 01/26/21  Yes Joni Reining, PA-C  benzonatate (TESSALON PERLES) 100 MG capsule Take 2 capsules (200 mg total) by mouth 3 (three) times daily as needed. 01/26/21 01/26/22 Yes Joni Reining, PA-C  methylPREDNISolone (MEDROL DOSEPAK) 4 MG TBPK tablet Take Tapered dose as directed 01/26/21  Yes Joni Reining, PA-C  doxycycline (VIBRA-TABS) 100 MG tablet Take 1 tablet (100 mg total) by mouth 2 (two) times daily. 10/04/18   Merrily Brittle, MD  HYDROcodone-acetaminophen (NORCO) 5-325 MG tablet Take 1 tablet by mouth every 6 (six) hours as needed for up to 7 doses for severe pain. 10/04/18   Merrily Brittle, MD  ibuprofen (ADVIL) 600 MG tablet Take 1 tablet (600 mg total) by mouth every 6 (six) hours as needed. 09/20/20   Dionne Bucy, MD    Allergies Patient has no known allergies.  No  family history on file.  Social History Social History   Tobacco Use  . Smoking status: Current Every Day Smoker  . Smokeless tobacco: Never Used  Substance Use Topics  . Alcohol use: Not Currently  . Drug use: Not Currently    Review of Systems Constitutional: No fever/chills Eyes: No visual changes. ENT: No sore throat. Cardiovascular: Denies chest pain. Respiratory: shortness of breath and wheezing with Gastrointestinal: No abdominal pain.  No nausea, no vomiting.  No diarrhea.  No constipation. Genitourinary: Negative for dysuria. Musculoskeletal: Negative for back pain. Skin: Negative for rash. Neurological: Negative for headaches, focal weakness or numbness. ____________________________________________   PHYSICAL EXAM:  VITAL SIGNS: ED Triage Vitals  Enc Vitals Group     BP 01/26/21 1237 126/71     Pulse Rate 01/26/21 1237 (!) 109     Resp 01/26/21 1237 (!) 24     Temp 01/26/21 1237 98.9 F (37.2 C)     Temp Source 01/26/21 1237 Oral     SpO2 01/26/21 1237 97 %     Weight 01/26/21 1238 210 lb (95.3 kg)     Height 01/26/21 1238 5\' 2"  (1.575 m)     Head Circumference --      Peak Flow --      Pain Score 01/26/21 1238 7     Pain Loc --      Pain Edu? --      Excl. in GC? --     Constitutional: Alert and oriented. Well appearing  and in no acute distress. Eyes: Conjunctivae are normal. PERRL. EOMI. Head: Atraumatic. Nose: No congestion/rhinnorhea. Mouth/Throat: Mucous membranes are moist.  Oropharynx non-erythematous. Neck: No stridor.  No cervical spine tenderness to palpation. Hematological/Lymphatic/Immunilogical: No cervical lymphadenopathy. Cardiovascular: Tachycardic, regular rhythm. Grossly normal heart sounds.  Good peripheral circulation. Respiratory:Tachypnea.  No retractions. Lungs bilateral wheezing. Gastrointestinal: Soft and nontender. No distention. No abdominal bruits. No CVA tenderness. Musculoskeletal: No lower extremity tenderness nor  edema.  No joint effusions. Neurologic:  Normal speech and language. No gross focal neurologic deficits are appreciated. No gait instability. Skin:  Skin is warm, dry and intact. No rash noted. Psychiatric: Mood and affect are normal. Speech and behavior are normal.  ____________________________________________   LABS (all labs ordered are listed, but only abnormal results are displayed)  Labs Reviewed - No data to display ____________________________________________  EKG   ____________________________________________  RADIOLOGY I, Joni Reining, personally viewed and evaluated these images (plain radiographs) as part of my medical decision making, as well as reviewing the written report by the radiologist.  ED MD interpretation: No acute findings on chest x-ray. Official radiology report(s): DG Chest 2 View  Result Date: 01/26/2021 CLINICAL DATA:  Wheezing.  Tachypnea.  Cough. EXAM: CHEST - 2 VIEW COMPARISON:  None. FINDINGS: The heart size and mediastinal contours are within normal limits. Both lungs are clear. The visualized skeletal structures are unremarkable. IMPRESSION: No active cardiopulmonary disease. Electronically Signed   By: Gerome Sam III M.D   On: 01/26/2021 13:12    ____________________________________________   PROCEDURES  Procedure(s) performed (including Critical Care):  Procedures   ____________________________________________   INITIAL IMPRESSION / ASSESSMENT AND PLAN / ED COURSE  As part of my medical decision making, I reviewed the following data within the electronic MEDICAL RECORD NUMBER         Patient presents with difficulty breathing which started last night.  Patient has a history of asthma and was using an over-the-counter inhaler with no noticeable relief.  Discussed no acute findings on chest x-ray.  Responded well after third nebulizer treatment.  Patient is able to talk in complete sentences on the phone.  Decreased wheezing all  lobes.  Patient given discharge care instructions.  Patient given a prescription for Ventolin inhaler, Tessalon Perles, and Medrol Dosepak.  Patient vies return to ED if condition worsens.      ____________________________________________   FINAL CLINICAL IMPRESSION(S) / ED DIAGNOSES  Final diagnoses:  Moderate persistent asthma with exacerbation     ED Discharge Orders         Ordered    methylPREDNISolone (MEDROL DOSEPAK) 4 MG TBPK tablet        01/26/21 1500    albuterol (VENTOLIN HFA) 108 (90 Base) MCG/ACT inhaler  Every 6 hours PRN        01/26/21 1500    benzonatate (TESSALON PERLES) 100 MG capsule  3 times daily PRN        01/26/21 1500          *Please note:  Jadan Jewel was evaluated in Emergency Department on 01/26/2021 for the symptoms described in the history of present illness. She was evaluated in the context of the global COVID-19 pandemic, which necessitated consideration that the patient might be at risk for infection with the SARS-CoV-2 virus that causes COVID-19. Institutional protocols and algorithms that pertain to the evaluation of patients at risk for COVID-19 are in a state of rapid change based on information released by regulatory bodies including the CDC  and federal and state organizations. These policies and algorithms were followed during the patient's care in the ED.  Some ED evaluations and interventions may be delayed as a result of limited staffing during and the pandemic.*   Note:  This document was prepared using Dragon voice recognition software and may include unintentional dictation errors.    Joni Reining, PA-C 01/26/21 1506    Sharman Cheek, MD 01/26/21 1540

## 2021-01-26 NOTE — Discharge Instructions (Signed)
No acute findings on chest x-ray.  Follow discharge care instructions and take medications as directed.

## 2021-08-29 IMAGING — CR DG CHEST 2V
2 series · 2 of 2 positions shown · non-contrast
Comparison: None.

CLINICAL DATA: Wheezing.  Tachypnea.  Cough.

EXAM:
CHEST - 2 VIEW

[chest pa]
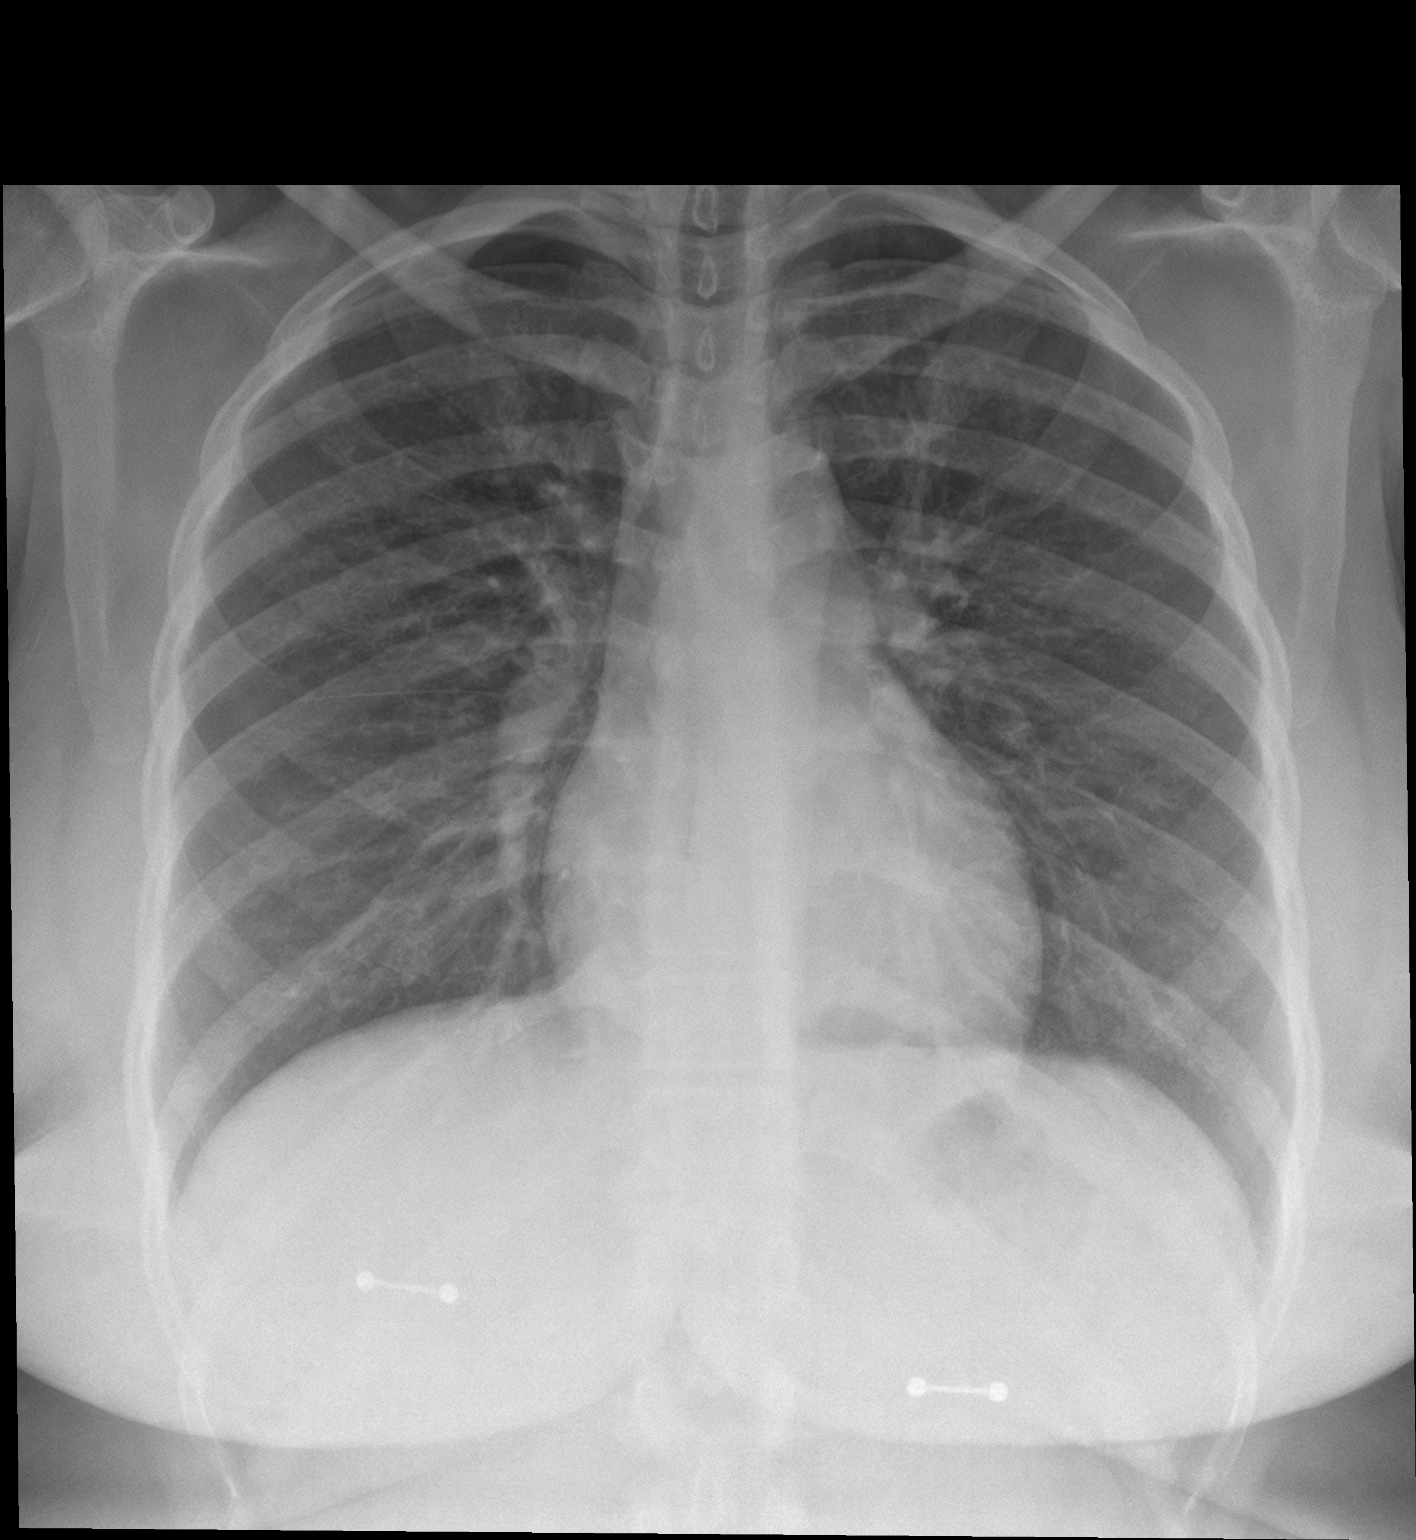

[chest lat]
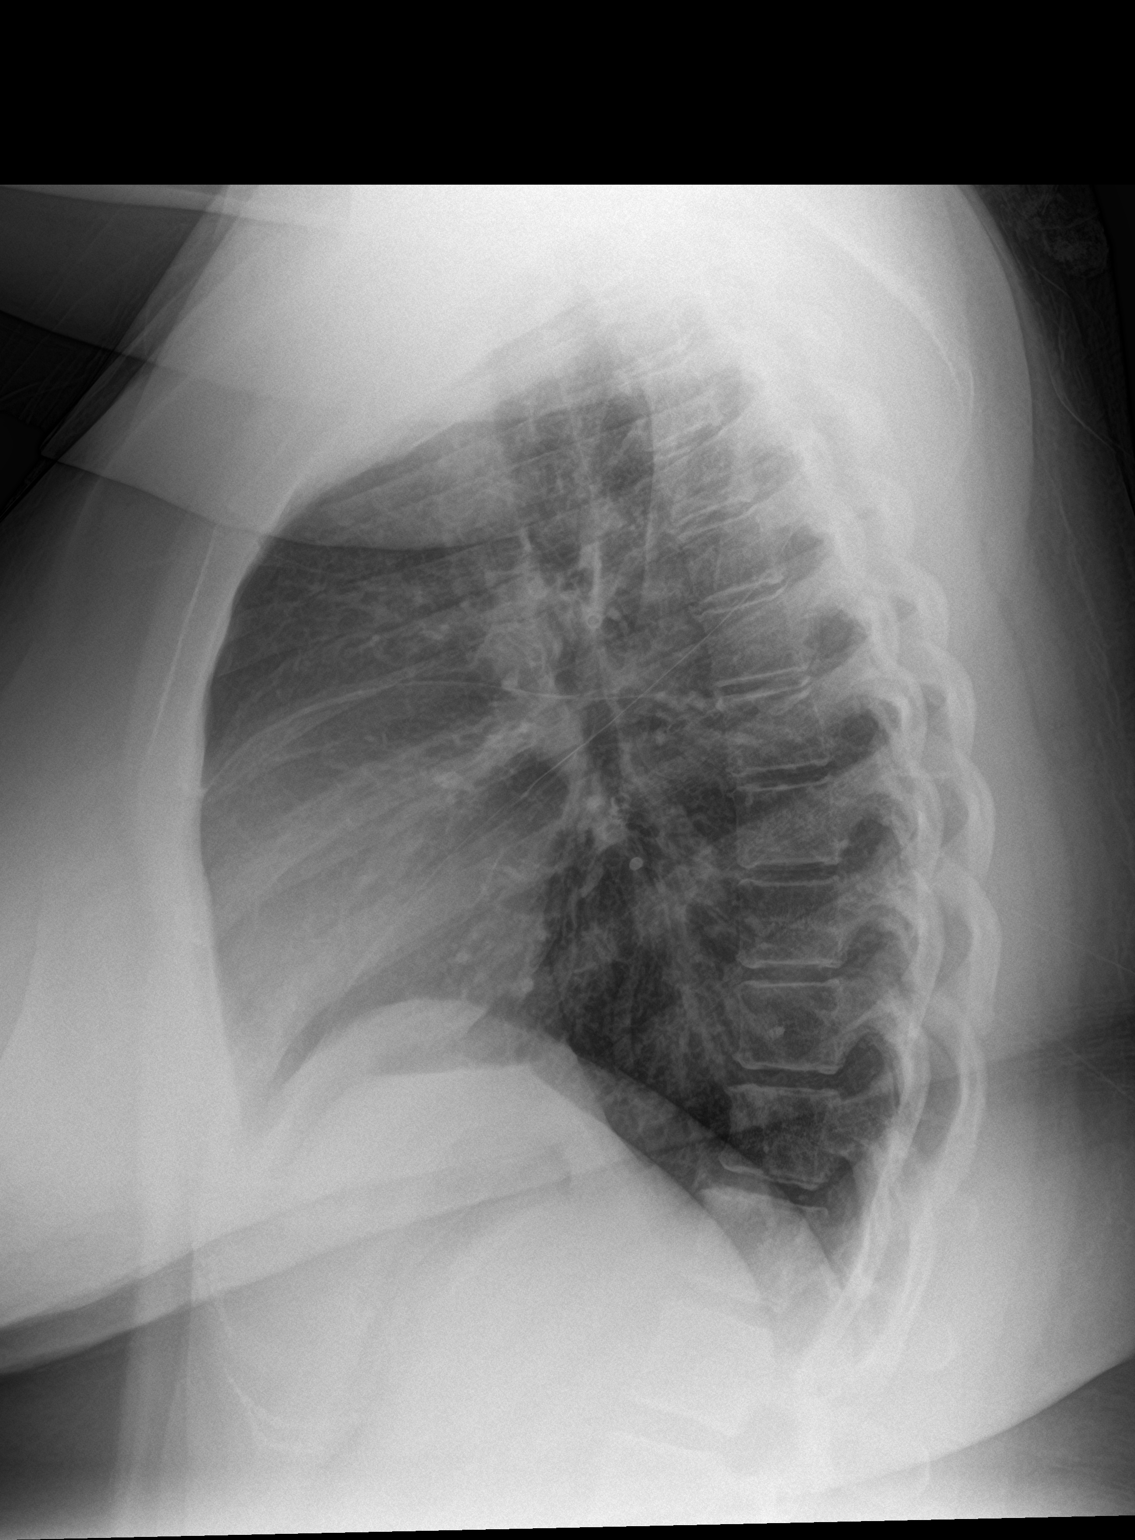

[2 of 2 positions shown; findings below may reference images not displayed]

FINDINGS: The heart size and mediastinal contours are within normal limits.
Both lungs are clear. The visualized skeletal structures are
unremarkable.
IMPRESSION: No active cardiopulmonary disease.

## 2022-07-24 DIAGNOSIS — Z3009 Encounter for other general counseling and advice on contraception: Secondary | ICD-10-CM | POA: Diagnosis not present

## 2022-07-24 DIAGNOSIS — Z0389 Encounter for observation for other suspected diseases and conditions ruled out: Secondary | ICD-10-CM | POA: Diagnosis not present

## 2022-07-24 DIAGNOSIS — Z1388 Encounter for screening for disorder due to exposure to contaminants: Secondary | ICD-10-CM | POA: Diagnosis not present

## 2022-09-05 DIAGNOSIS — Z419 Encounter for procedure for purposes other than remedying health state, unspecified: Secondary | ICD-10-CM | POA: Diagnosis not present

## 2022-10-06 DIAGNOSIS — Z419 Encounter for procedure for purposes other than remedying health state, unspecified: Secondary | ICD-10-CM | POA: Diagnosis not present

## 2022-11-06 DIAGNOSIS — Z419 Encounter for procedure for purposes other than remedying health state, unspecified: Secondary | ICD-10-CM | POA: Diagnosis not present

## 2022-11-20 ENCOUNTER — Telehealth: Payer: Self-pay

## 2022-11-20 NOTE — Telephone Encounter (Signed)
Mychart msg sent. AS, CMA

## 2022-12-05 DIAGNOSIS — Z419 Encounter for procedure for purposes other than remedying health state, unspecified: Secondary | ICD-10-CM | POA: Diagnosis not present

## 2023-01-05 DIAGNOSIS — Z419 Encounter for procedure for purposes other than remedying health state, unspecified: Secondary | ICD-10-CM | POA: Diagnosis not present

## 2023-02-04 DIAGNOSIS — Z419 Encounter for procedure for purposes other than remedying health state, unspecified: Secondary | ICD-10-CM | POA: Diagnosis not present

## 2023-03-07 DIAGNOSIS — Z419 Encounter for procedure for purposes other than remedying health state, unspecified: Secondary | ICD-10-CM | POA: Diagnosis not present

## 2023-04-06 DIAGNOSIS — Z419 Encounter for procedure for purposes other than remedying health state, unspecified: Secondary | ICD-10-CM | POA: Diagnosis not present

## 2023-05-07 DIAGNOSIS — Z419 Encounter for procedure for purposes other than remedying health state, unspecified: Secondary | ICD-10-CM | POA: Diagnosis not present

## 2023-06-07 DIAGNOSIS — Z419 Encounter for procedure for purposes other than remedying health state, unspecified: Secondary | ICD-10-CM | POA: Diagnosis not present

## 2023-07-07 DIAGNOSIS — Z419 Encounter for procedure for purposes other than remedying health state, unspecified: Secondary | ICD-10-CM | POA: Diagnosis not present

## 2023-08-07 DIAGNOSIS — Z419 Encounter for procedure for purposes other than remedying health state, unspecified: Secondary | ICD-10-CM | POA: Diagnosis not present

## 2023-09-06 DIAGNOSIS — Z419 Encounter for procedure for purposes other than remedying health state, unspecified: Secondary | ICD-10-CM | POA: Diagnosis not present

## 2023-10-07 DIAGNOSIS — Z419 Encounter for procedure for purposes other than remedying health state, unspecified: Secondary | ICD-10-CM | POA: Diagnosis not present

## 2023-11-07 DIAGNOSIS — Z419 Encounter for procedure for purposes other than remedying health state, unspecified: Secondary | ICD-10-CM | POA: Diagnosis not present

## 2023-12-05 DIAGNOSIS — Z419 Encounter for procedure for purposes other than remedying health state, unspecified: Secondary | ICD-10-CM | POA: Diagnosis not present

## 2024-01-16 DIAGNOSIS — Z419 Encounter for procedure for purposes other than remedying health state, unspecified: Secondary | ICD-10-CM | POA: Diagnosis not present

## 2024-02-15 DIAGNOSIS — Z419 Encounter for procedure for purposes other than remedying health state, unspecified: Secondary | ICD-10-CM | POA: Diagnosis not present

## 2024-03-17 DIAGNOSIS — Z419 Encounter for procedure for purposes other than remedying health state, unspecified: Secondary | ICD-10-CM | POA: Diagnosis not present

## 2024-04-16 DIAGNOSIS — Z419 Encounter for procedure for purposes other than remedying health state, unspecified: Secondary | ICD-10-CM | POA: Diagnosis not present

## 2024-05-17 DIAGNOSIS — Z419 Encounter for procedure for purposes other than remedying health state, unspecified: Secondary | ICD-10-CM | POA: Diagnosis not present

## 2024-06-17 DIAGNOSIS — Z419 Encounter for procedure for purposes other than remedying health state, unspecified: Secondary | ICD-10-CM | POA: Diagnosis not present
